# Patient Record
Sex: Male | Born: 1980 | Race: White | Hispanic: No | Marital: Single | Smoking: Current every day smoker
Health system: Southern US, Community
[De-identification: ages and names within clinical notes are randomized; demographics above are authoritative.]

## PROBLEM LIST (undated history)

## (undated) DIAGNOSIS — F1120 Opioid dependence, uncomplicated: Secondary | ICD-10-CM

## (undated) DIAGNOSIS — M5136 Other intervertebral disc degeneration, lumbar region: Secondary | ICD-10-CM

## (undated) DIAGNOSIS — M5126 Other intervertebral disc displacement, lumbar region: Secondary | ICD-10-CM

---

## 2019-08-10 ENCOUNTER — Emergency Department (HOSPITAL_COMMUNITY)
Admission: EM | Admit: 2019-08-10 | Discharge: 2019-08-10 | Discharge status: Against Medical Advice | Payer: Medicaid Other | Attending: Emergency Medicine | Admitting: Emergency Medicine

## 2019-08-10 ENCOUNTER — Other Ambulatory Visit: Payer: Self-pay

## 2019-08-10 ENCOUNTER — Encounter (HOSPITAL_COMMUNITY): Payer: Self-pay | Admitting: Emergency Medicine

## 2019-08-10 DIAGNOSIS — F17210 Nicotine dependence, cigarettes, uncomplicated: Secondary | ICD-10-CM | POA: Insufficient documentation

## 2019-08-10 DIAGNOSIS — Z5321 Procedure and treatment not carried out due to patient leaving prior to being seen by health care provider: Secondary | ICD-10-CM | POA: Diagnosis not present

## 2019-08-10 DIAGNOSIS — F1123 Opioid dependence with withdrawal: Secondary | ICD-10-CM | POA: Diagnosis not present

## 2019-08-10 DIAGNOSIS — F1193 Opioid use, unspecified with withdrawal: Secondary | ICD-10-CM

## 2019-08-10 MED ORDER — CLONIDINE HCL 0.1 MG PO TABS: 0.1000 mg | ORAL_TABLET | Freq: Once | ORAL | Status: DC

## 2019-08-10 MED ORDER — SODIUM CHLORIDE 0.9 % IV BOLUS (SEPSIS): 1000.0000 mL | Freq: Once | INTRAVENOUS | Status: DC

## 2019-08-10 NOTE — ED Provider Notes (Signed)
[facility] EMERGENCY DEPARTMENT Provider Note   CSN: [id number] Arrival date & time: 08/10/19  1001     History   Chief Complaint Chief Complaint  Patient presents with  . Withdrawal    HPI Ethan Hatfield is a 38 y.o. male.     Patient is a 38 year old gentleman brought in by law enforcement for drug withdrawal symptoms.  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  Patient reports that he has not used since sometime last night.  Reports that he is feeling anxious, jittery, sweating, vomiting and yawning.  Patient has been through withdrawal before and this feels the same.  He denies any alcohol or benzodiazepine use.     No past medical history on file.  There are no active problems to display for this patient.         Home Medications    Prior to Admission medications   Not on File    Family History No family history on file.  Social History Social History   Tobacco Use  . Smoking status: Current Every Day Smoker    Packs/day: 1.00  Substance Use Topics  . Alcohol use: Yes    Comment: occ  . Drug use: Yes    Comment: pt reports fentanyl every 3-4 hours, and meth with last use on 9/12     Allergies   Patient has no known allergies.   Review of Systems Review of Systems  Constitutional: Positive for appetite change, diaphoresis and fatigue. Negative for fever.  Respiratory: Negative for shortness of breath.   Cardiovascular: Negative for chest pain.  Gastrointestinal: Positive for nausea and vomiting. Negative for abdominal pain and diarrhea.  Genitourinary: Negative for dysuria.  Skin: Negative for rash and wound.  Neurological: Positive for tremors. Negative for dizziness and seizures.  Psychiatric/Behavioral: Positive for agitation and sleep disturbance. Negative for confusion and suicidal ideas. The patient is nervous/anxious.   All other systems reviewed and are negative.    Physical  Exam Updated Vital Signs BP (!) 146/73 (BP Location: Left Arm)   Pulse (!) 106   Temp 98.4 F (36.9 C) (Oral)   Resp 20   Ht 5\' 9"  (1.753 m)   Wt 83.9 kg   SpO2 93%   BMI 27.32 kg/m   Physical Exam Vitals signs and nursing note reviewed.  Constitutional:      Appearance: Normal appearance.     Comments: Mildly diaphoretic  HENT:     Head: Normocephalic.     Nose: Nose normal.     Mouth/Throat:     Mouth: Mucous membranes are moist.  Eyes:     Conjunctiva/sclera: Conjunctivae normal.  Cardiovascular:     Rate and Rhythm: Normal rate and regular rhythm.  Pulmonary:     Effort: Pulmonary effort is normal.  Abdominal:     General: Abdomen is flat. Bowel sounds are normal. There is no distension.     Tenderness: There is no abdominal tenderness.  Skin:    General: Skin is dry.  Neurological:     Mental Status: He is alert.     Comments: Mild tremor at rest, patient yawning  Psychiatric:        Mood and Affect: Mood normal.      ED Treatments / Results  Labs (all labs ordered are listed, but only abnormal results are displayed) Labs Reviewed - No data to display  EKG EKG Interpretation  Date/Time:  Saturday August 10 2019 10:29:27  EDT Ventricular Rate:  98 PR Interval:    QRS Duration: 112 QT Interval:  373 QTC Calculation: 477 R Axis:   109 Text Interpretation:  Sinus rhythm Consider left ventricular hypertrophy Borderline ST elevation, lateral leads Borderline prolonged QT interval No old tracing to compare Confirmed by Samuel JesterMcManus, Kathleen [phone]([id number]) on 08/10/2019 12:11:50 PM   Radiology No results found.  Procedures Procedures (including critical care time)  Medications Ordered in ED Medications - No data to display   Initial Impression / Assessment and Plan / ED Course  I have reviewed the triage vital signs and the nursing notes.  Pertinent labs & imaging results that were available during my care of the patient were reviewed by me and considered  in my medical decision making (see chart for details).  Clinical Course as of Aug 10 1955  Sat Aug 10, 2019  1301 Upon going to reassess the patient, he was no longer in the room. Per staff, he left AMA.  Patient appeared to be in moderate withdrawal and clonidine and saline bolus was ordered for such, however the patient left prior to this treatment.   [KM]    Clinical Course User Index [KM] Arlyn DunningMcLean, Rikita Grabert A, PA-C         Final Clinical Impressions(s) / ED Diagnoses   Final diagnoses:  None    ED Discharge Orders    None       Jeral PinchMcLean, Detrice Cales A, PA-C 08/10/19 1956    Vanetta MuldersZackowski, Scott, MD 08/24/19 [phone]

## 2019-08-10 NOTE — ED Notes (Signed)
Patient's medication's being pulled from pyxis when patient came out of room and was trying to ambulate in hall. Patient told that he needed to return to his room so that he can be given medication and that he could not walk around in hallway due to Covid. Patient states Then I'm just going to leave then." Patient told that if he wanted to leave he would need to sign out AMA. Patient states "I'm not the one wanting to leave. Ya''ll are making me." Patient told that he was not being told to leave that he just needed to return to room due to Covid. Patient told that he had medication ordered to help with his withdrawals. Patient states "I don't want it. I'm leaving." Patient still has leads on him." Patient walking down hall wrapped in blanket. Patient told leads needed to be returned. Patient ripped leads off and through them in the hall. Patient refusing to sign AMA. Security made aware.

## 2019-08-10 NOTE — ED Notes (Signed)
Pt came out of his room and became belligerent to staff asking to leave. When attempted to stop pt to remove his monitor leads, he ripped them off and threw them in the floor. Pt does not have IV access. Security called and pt left out ED entrance.

## 2019-08-10 NOTE — ED Triage Notes (Signed)
Per EMS, pt was arrested this am and reported chest pain/shortness of breath while in custody. Pt reports typically uses fentanyl every 3-4 hours, last dose "over 12 hours ago." pt reports last use of meth "yesterday." pt alert, diaphoretic. VSS at this time.

## 2019-09-10 ENCOUNTER — Emergency Department (HOSPITAL_COMMUNITY): Payer: Medicaid Other

## 2019-09-10 ENCOUNTER — Encounter (HOSPITAL_COMMUNITY): Payer: Self-pay

## 2019-09-10 ENCOUNTER — Other Ambulatory Visit: Payer: Self-pay

## 2019-09-10 DIAGNOSIS — L02415 Cutaneous abscess of right lower limb: Secondary | ICD-10-CM | POA: Diagnosis not present

## 2019-09-10 DIAGNOSIS — L03115 Cellulitis of right lower limb: Secondary | ICD-10-CM | POA: Diagnosis not present

## 2019-09-10 DIAGNOSIS — M795 Residual foreign body in soft tissue: Secondary | ICD-10-CM | POA: Diagnosis not present

## 2019-09-10 DIAGNOSIS — M79651 Pain in right thigh: Secondary | ICD-10-CM | POA: Diagnosis present

## 2019-09-10 DIAGNOSIS — F17210 Nicotine dependence, cigarettes, uncomplicated: Secondary | ICD-10-CM | POA: Diagnosis not present

## 2019-09-10 NOTE — ED Triage Notes (Addendum)
Pt brought to ED by [facility] for right leg pain x 2 weeks. Pt states he was smoking pot and the bowl broke and glass cut his leg. Pt leg swollen, red with laceration. Pt denies fever. Pt with history of drug use, requesting information for detox programs.

## 2019-09-11 ENCOUNTER — Emergency Department (HOSPITAL_COMMUNITY)
Admission: EM | Admit: 2019-09-11 | Discharge: 2019-09-11 | Disposition: A | Discharge status: Home / Self Care | Payer: Medicaid Other | Attending: Emergency Medicine | Admitting: Emergency Medicine

## 2019-09-11 DIAGNOSIS — Z189 Retained foreign body fragments, unspecified material: Secondary | ICD-10-CM

## 2019-09-11 DIAGNOSIS — L03115 Cellulitis of right lower limb: Secondary | ICD-10-CM

## 2019-09-11 HISTORY — DX: Other intervertebral disc displacement, lumbar region: M51.26

## 2019-09-11 HISTORY — DX: Opioid dependence, uncomplicated: F11.20

## 2019-09-11 HISTORY — DX: Other intervertebral disc degeneration, lumbar region: M51.36

## 2019-09-11 LAB — CBC WITH DIFFERENTIAL/PLATELET
Abs Immature Granulocytes: 0.05 10*3/uL (ref 0.00–0.07)
Basophils Absolute: 0.1 10*3/uL (ref 0.0–0.1)
Basophils Relative: 0 %
Eosinophils Absolute: 0.2 10*3/uL (ref 0.0–0.5)
Eosinophils Relative: 1 %
HCT: 40.7 % (ref 39.0–52.0)
Hemoglobin: 13.4 g/dL (ref 13.0–17.0)
Immature Granulocytes: 0 %
Lymphocytes Relative: 20 %
Lymphs Abs: 3 10*3/uL (ref 0.7–4.0)
MCH: 32.2 pg (ref 26.0–34.0)
MCHC: 32.9 g/dL (ref 30.0–36.0)
MCV: 97.8 fL (ref 80.0–100.0)
Monocytes Absolute: 0.6 10*3/uL (ref 0.1–1.0)
Monocytes Relative: 4 %
Neutro Abs: 10.8 10*3/uL — ABNORMAL HIGH (ref 1.7–7.7)
Neutrophils Relative %: 75 %
Platelets: 244 10*3/uL (ref 150–400)
RBC: 4.16 MIL/uL — ABNORMAL LOW (ref 4.22–5.81)
RDW: 12.2 % (ref 11.5–15.5)
WBC: 14.7 10*3/uL — ABNORMAL HIGH (ref 4.0–10.5)
nRBC: 0 % (ref 0.0–0.2)

## 2019-09-11 LAB — COMPREHENSIVE METABOLIC PANEL
ALT: 18 U/L (ref 0–44)
AST: 16 U/L (ref 15–41)
Albumin: 3.6 g/dL (ref 3.5–5.0)
Alkaline Phosphatase: 50 U/L (ref 38–126)
Anion gap: 6 (ref 5–15)
BUN: 14 mg/dL (ref 6–20)
CO2: 29 mmol/L (ref 22–32)
Calcium: 8.4 mg/dL — ABNORMAL LOW (ref 8.9–10.3)
Chloride: 100 mmol/L (ref 98–111)
Creatinine, Ser: 0.76 mg/dL (ref 0.61–1.24)
GFR calc Af Amer: >60 mL/min (ref >60)
GFR calc non Af Amer: >60 mL/min (ref >60)
Glucose, Bld: 109 mg/dL — ABNORMAL HIGH (ref 70–99)
Potassium: 3.6 mmol/L (ref 3.5–5.1)
Sodium: 135 mmol/L (ref 135–145)
Total Bilirubin: 0.4 mg/dL (ref 0.3–1.2)
Total Protein: 6.7 g/dL (ref 6.5–8.1)

## 2019-09-11 LAB — LACTIC ACID, PLASMA: Lactic Acid, Venous: 0.7 mmol/L (ref 0.5–1.9)

## 2019-09-11 MED ORDER — CLINDAMYCIN HCL 300 MG PO CAPS: 300.0000 mg | ORAL_CAPSULE | Freq: Three times a day (TID) | ORAL | 0 refills | Status: AC

## 2019-09-11 MED ORDER — CLINDAMYCIN PHOSPHATE 600 MG/50ML IV SOLN
600.0000 mg | Freq: Once | INTRAVENOUS | Status: DC
  Filled 2019-09-11: qty 50

## 2019-09-11 MED ORDER — LIDOCAINE-EPINEPHRINE (PF) 1 %-1:200000 IJ SOLN
20.0000 mL | Freq: Once | INTRAMUSCULAR | Status: AC
  Administered 2019-09-11: 20 mL

## 2019-09-11 MED ORDER — OXYCODONE-ACETAMINOPHEN 5-325 MG PO TABS
2.0000 | ORAL_TABLET | Freq: Once | ORAL | Status: AC
  Administered 2019-09-11: 02:00:00 2 via ORAL
  Filled 2019-09-11: qty 2

## 2019-09-11 NOTE — ED Provider Notes (Signed)
[facility] EMERGENCY DEPARTMENT Provider Note   CSN: [id number] Arrival date & time: 09/10/19  2224     History   Chief Complaint Chief Complaint  Patient presents with  . Leg Pain    HPI Ethan Hatfield is a 38 y.o. male.     Patient with previous history of substance abuse presenting with right leg pain and wound.  States he was smoking marijuana approximately 10 days ago and he put the glass bowl in his sock to hide it.  A door then hit his leg and ruptured the glass bowl causing it to break and cut his leg.  he did not see anyone about this initially.  He states her multiple glass fragments that he removed on his own but is not sure that he got them all. He comes in today with worsening pain, swelling, redness and drainage.  He has had increased drainage from the wound surrounding redness extending up his leg and swelling of his leg. Denies fevers, chills, nausea, vomiting. States last IV drug use was 30 days ago. States he has used IV drugs in the past but has not for about the past 1 month.  Denies any history of diabetes. Tetanus is up-to-date.  The history is provided by the patient and the EMS personnel.  Leg Pain Associated symptoms: no fatigue and no fever     Past Medical History:  Diagnosis Date  . Bulging lumbar disc   . Opiate dependence (HCC)     There are no active problems to display for this patient.   History reviewed. No pertinent surgical history.      Home Medications    Prior to Admission medications   Not on File    Family History No family history on file.  Social History Social History   Tobacco Use  . Smoking status: Current Every Day Smoker    Packs/day: 1.00  . Smokeless tobacco: Never Used  Substance Use Topics  . Alcohol use: Yes    Comment: occ  . Drug use: Yes    Types: Marijuana    Comment: pt reports fentanyl every 3-4 hours, and meth with last use on 10/12,     Allergies   Patient has no known allergies.    Review of Systems Review of Systems  Constitutional: Negative for activity change, appetite change, fatigue and fever.  HENT: Negative for congestion and rhinorrhea.   Eyes: Negative for visual disturbance.  Respiratory: Negative for cough, choking and shortness of breath.   Cardiovascular: Negative for chest pain.  Gastrointestinal: Negative for abdominal pain, nausea and vomiting.  Genitourinary: Negative for dysuria and hematuria.  Musculoskeletal: Negative for arthralgias and myalgias.  Skin: Positive for rash and wound.  Neurological: Negative for dizziness, weakness and headaches.   all other systems are negative except as noted in the HPI and PMH.     Physical Exam Updated Vital Signs BP (!) 152/83 (BP Location: Right Arm)   Pulse (!) 108   Temp 98.5 F (36.9 C) (Oral)   Resp 20   Ht  (1.753 m)   Wt 79.4 kg   SpO2 100%   BMI 25.84 kg/m   Physical Exam Vitals signs and nursing note reviewed.  Constitutional:      General: He is not in acute distress.    Appearance: He is well-developed.  HENT:     Head: Normocephalic and atraumatic.     Mouth/Throat:     Pharynx: No oropharyngeal exudate.  Eyes:  Conjunctiva/sclera: Conjunctivae normal.     Pupils: Pupils are equal, round, and reactive to light.  Neck:     Musculoskeletal: Normal range of motion and neck supple.     Comments: No meningismus. Cardiovascular:     Rate and Rhythm: Normal rate and regular rhythm.     Heart sounds: Normal heart sounds. No murmur.  Pulmonary:     Effort: Pulmonary effort is normal. No respiratory distress.     Breath sounds: Normal breath sounds.  Chest:     Chest wall: No tenderness.  Abdominal:     Palpations: Abdomen is soft.     Tenderness: There is no abdominal tenderness. There is no guarding or rebound.  Musculoskeletal: Normal range of motion.        General: Swelling, tenderness and signs of injury present.     Right lower leg: Edema present.     Comments:  Right lower leg has an open wound with some clear purulent drainage and surrounding erythema.  Erythema extends across right lower leg with tenderness.  Compartments are soft. FROM hip and knee.  Intact DP and PT pulses.  Skin:    General: Skin is warm.     Capillary Refill: Capillary refill takes less than 2 seconds.     Findings: Erythema, lesion and rash present.  Neurological:     Mental Status: He is alert and oriented to person, place, and time.     Cranial Nerves: No cranial nerve deficit.     Motor: No abnormal muscle tone.     Coordination: Coordination normal.     Comments: No ataxia on finger to nose bilaterally. No pronator drift. 5/5 strength throughout. CN 2-12 intact.Equal grip strength. Sensation intact.   Psychiatric:        Behavior: Behavior normal.          ED Treatments / Results  Labs (all labs ordered are listed, but only abnormal results are displayed) Labs Reviewed  CBC WITH DIFFERENTIAL/PLATELET - Abnormal; Notable for the following components:      Result Value   WBC 14.7 (*)    RBC 4.16 (*)    Neutro Abs 10.8 (*)    All other components within normal limits  COMPREHENSIVE METABOLIC PANEL - Abnormal; Notable for the following components:   Glucose, Bld 109 (*)    Calcium 8.4 (*)    All other components within normal limits  CULTURE, BLOOD (ROUTINE X 2)  LACTIC ACID, PLASMA    EKG None  Radiology Dg Tibia/fibula Right  Result Date: 09/10/2019 CLINICAL DATA:  38 year old male with 2 weeks of right leg pain after laceration from broken glass. Anterior tib fib area of injury. EXAM: RIGHT TIBIA AND FIBULA - 2 VIEW COMPARISON:  None. FINDINGS: There are several faint linear retained foreign body suspected along the medial distal tibia soft tissues in an area of focal soft tissue swelling and stranding (arrows). No superimposed soft tissue gas. No osseous abnormality identified aside from some degenerative calcaneus spurring. IMPRESSION: 1.  Multiple small linear retained foreign bodies in the area of soft tissue swelling along the medial distal tibia. 2. No acute osseous abnormality. Electronically Signed   By: Genevie Ann M.D.   On: 09/10/2019 23:04    Procedures Ultrasound ED Soft Tissue  Date/Time: 09/11/2019 1:43 AM Performed by: Ezequiel Essex, MD Authorized by: Ezequiel Essex, MD   Procedure details:    Indications: limb pain, localization of abscess, evaluate for cellulitis and evaluate for foreign body  Transverse view:  Visualized   Longitudinal view:  Visualized   Images: archived     Limitations:  Body habitus Location:    Location: lower extremity     Side:  Right Findings:     abscess present    cellulitis present    no foreign body present .Marland KitchenIncision and Drainage  Date/Time: 09/11/2019 2:33 AM Performed by: Glynn Octave, MD Authorized by: Glynn Octave, MD   Consent:    Consent obtained:  Verbal   Consent given by:  Patient   Risks discussed:  Damage to other organs, infection, incomplete drainage, pain and bleeding   Alternatives discussed:  No treatment Location:    Type:  Abscess   Size:  2   Location:  Lower extremity   Lower extremity location:  Leg   Leg location:  R lower leg Pre-procedure details:    Skin preparation:  Betadine Anesthesia (see MAR for exact dosages):    Anesthesia method:  Local infiltration   Local anesthetic:  Lidocaine 1% w/o epi Procedure type:    Complexity:  Simple Procedure details:    Needle aspiration: no     Incision types:  Stab incision   Incision depth:  Subcutaneous   Scalpel blade:  11   Wound management:  Probed and deloculated and irrigated with saline   Drainage:  Purulent   Drainage amount:  Moderate   Wound treatment:  Wound left open   Packing materials:  None Post-procedure details:    Patient tolerance of procedure:  Tolerated well, no immediate complications .Foreign Body Removal  Date/Time: 09/11/2019 5:27 AM Performed  by: Glynn Octave, MD Authorized by: Glynn Octave, MD  Consent: Verbal consent obtained. Risks and benefits: risks, benefits and alternatives were discussed Consent given by: patient Site marked: the operative site was marked Imaging studies: imaging studies available Patient identity confirmed: verbally with patient and provided demographic data Time out: Immediately prior to procedure a "time out" was called to verify the correct patient, procedure, equipment, support staff and site/side marked as required. Body area: skin General location: lower extremity Location details: right lower leg Anesthesia: local infiltration  Anesthesia: Local Anesthetic: lidocaine 1% without epinephrine  Sedation: Patient sedated: no  Patient restrained: no Patient cooperative: yes Localization method: probed and visualized Removal mechanism: forceps Tendon involvement: superficial Complexity: simple 2 objects recovered. Objects recovered: glass Post-procedure assessment: residual foreign bodies remain Patient tolerance: patient tolerated the procedure well with no immediate complications   (including critical care time)  Medications Ordered in ED Medications  clindamycin (CLEOCIN) IVPB 600 mg (has no administration in time range)     Initial Impression / Assessment and Plan / ED Course  I have reviewed the triage vital signs and the nursing notes.  Pertinent labs & imaging results that were available during my care of the patient were reviewed by me and considered in my medical decision making (see chart for details).        Wound to right leg from broken glass 10 days ago.  No fever.  Tetanus is up-to-date.  X-ray shows possible small retained glass fragments.  No fracture. Discussed with patient that the small foreign bodies did not need to be removed emergently and will likely work themselves out. He is agreeable to wound exploration.  Bedside ultrasound shows possible  fluid collection.  Incision and drainage was performed with expression of some purulence and a few small glass fragments.  Wound was irrigated.  Patient given dose of IV clindamycin.  Blood  cultures obtained.  Patient does not want to be admitted.  He is agreeable to wound check in 2 days with Dr. Romeo AppleHarrison or in the ED.  He is aware that the small glass fragments could remain in his wound but they have been there for 10 days and do not need to be emergently removed today.  They will likely work themselves out of the wound.  He does not appear to be toxic or septic.  He is agreeable to recheck in 2 days with Dr. Romeo AppleHarrison in the ED.  Return to the ED sooner with worsening pain, spreading redness, fever, vomiting, any other concerns. Final Clinical Impressions(s) / ED Diagnoses   Final diagnoses:  Cellulitis of leg, right  Retained foreign body    ED Discharge Orders         Ordered    clindamycin (CLEOCIN) 300 MG capsule  3 times daily     09/11/19 0455           Glynn Octaveancour, Severina Sykora, MD 09/11/19 [phone]

## 2019-09-11 NOTE — Discharge Instructions (Signed)
Take the antibiotics as prescribed.  Perform warm soaks in the tub twice daily.  There may be some tiny glass fragments that remain. keep the wound open.  Follow-up for wound check in 2 days with Dr. Aline Brochure or in the ED if you are not able to see him.  Return to the ED sooner if you develop worsening pain, spreading redness, fever, vomiting, any concerns.

## 2019-09-16 LAB — CULTURE, BLOOD (ROUTINE X 2): Culture: NO GROWTH

## 2020-01-21 ENCOUNTER — Other Ambulatory Visit: Payer: Self-pay

## 2020-01-21 ENCOUNTER — Encounter (HOSPITAL_COMMUNITY): Payer: Self-pay | Admitting: Emergency Medicine

## 2020-01-21 ENCOUNTER — Emergency Department (HOSPITAL_COMMUNITY): Payer: Medicaid Other

## 2020-01-21 ENCOUNTER — Emergency Department (HOSPITAL_COMMUNITY)
Admission: EM | Admit: 2020-01-21 | Discharge: 2020-01-21 | Disposition: A | Discharge status: Home / Self Care | Payer: Medicaid Other | Attending: Emergency Medicine | Admitting: Emergency Medicine

## 2020-01-21 DIAGNOSIS — Z20822 Contact with and (suspected) exposure to covid-19: Secondary | ICD-10-CM | POA: Diagnosis not present

## 2020-01-21 DIAGNOSIS — Z79899 Other long term (current) drug therapy: Secondary | ICD-10-CM | POA: Diagnosis not present

## 2020-01-21 DIAGNOSIS — R569 Unspecified convulsions: Secondary | ICD-10-CM | POA: Diagnosis not present

## 2020-01-21 DIAGNOSIS — F17200 Nicotine dependence, unspecified, uncomplicated: Secondary | ICD-10-CM | POA: Insufficient documentation

## 2020-01-21 LAB — CBC WITH DIFFERENTIAL/PLATELET
Abs Immature Granulocytes: 0.03 10*3/uL (ref 0.00–0.07)
Basophils Absolute: 0 10*3/uL (ref 0.0–0.1)
Basophils Relative: 0 %
Eosinophils Absolute: 0 10*3/uL (ref 0.0–0.5)
Eosinophils Relative: 0 %
HCT: 49.3 % (ref 39.0–52.0)
Hemoglobin: 16.7 g/dL (ref 13.0–17.0)
Immature Granulocytes: 0 %
Lymphocytes Relative: 24 %
Lymphs Abs: 2.3 10*3/uL (ref 0.7–4.0)
MCH: 31.4 pg (ref 26.0–34.0)
MCHC: 33.9 g/dL (ref 30.0–36.0)
MCV: 92.7 fL (ref 80.0–100.0)
Monocytes Absolute: 0.4 10*3/uL (ref 0.1–1.0)
Monocytes Relative: 4 %
Neutro Abs: 7 10*3/uL (ref 1.7–7.7)
Neutrophils Relative %: 72 %
Platelets: 377 10*3/uL (ref 150–400)
RBC: 5.32 MIL/uL (ref 4.22–5.81)
RDW: 12.3 % (ref 11.5–15.5)
WBC: 9.8 10*3/uL (ref 4.0–10.5)
nRBC: 0 % (ref 0.0–0.2)

## 2020-01-21 LAB — COMPREHENSIVE METABOLIC PANEL WITH GFR
ALT: 41 U/L (ref 0–44)
AST: 29 U/L (ref 15–41)
Albumin: 4.1 g/dL (ref 3.5–5.0)
Alkaline Phosphatase: 57 U/L (ref 38–126)
Anion gap: 12 (ref 5–15)
BUN: 16 mg/dL (ref 6–20)
CO2: 26 mmol/L (ref 22–32)
Calcium: 9.4 mg/dL (ref 8.9–10.3)
Chloride: 100 mmol/L (ref 98–111)
Creatinine, Ser: 0.88 mg/dL (ref 0.61–1.24)
GFR calc Af Amer: >60 mL/min
GFR calc non Af Amer: >60 mL/min
Glucose, Bld: 98 mg/dL (ref 70–99)
Potassium: 2.9 mmol/L — ABNORMAL LOW (ref 3.5–5.1)
Sodium: 138 mmol/L (ref 135–145)
Total Bilirubin: 0.9 mg/dL (ref 0.3–1.2)
Total Protein: 8.2 g/dL — ABNORMAL HIGH (ref 6.5–8.1)

## 2020-01-21 LAB — URINALYSIS, ROUTINE W REFLEX MICROSCOPIC
Bilirubin Urine: NEGATIVE
Glucose, UA: NEGATIVE mg/dL
Hgb urine dipstick: NEGATIVE
Ketones, ur: 5 mg/dL — AB
Leukocytes,Ua: NEGATIVE
Nitrite: NEGATIVE
Protein, ur: NEGATIVE mg/dL
Specific Gravity, Urine: 1.02 (ref 1.005–1.030)
pH: 6 (ref 5.0–8.0)

## 2020-01-21 LAB — CBG MONITORING, ED: Glucose-Capillary: 97 mg/dL (ref 70–99)

## 2020-01-21 LAB — RAPID URINE DRUG SCREEN, HOSP PERFORMED
Amphetamines: POSITIVE — AB
Barbiturates: NOT DETECTED
Benzodiazepines: NOT DETECTED
Cocaine: NOT DETECTED
Opiates: NOT DETECTED
Tetrahydrocannabinol: POSITIVE — AB

## 2020-01-21 LAB — MAGNESIUM: Magnesium: 1.8 mg/dL (ref 1.7–2.4)

## 2020-01-21 LAB — PHOSPHORUS: Phosphorus: 3.2 mg/dL (ref 2.5–4.6)

## 2020-01-21 LAB — ETHANOL: Alcohol, Ethyl (B): <10 mg/dL (ref <10)

## 2020-01-21 LAB — POC SARS CORONAVIRUS 2 AG -  ED: SARS Coronavirus 2 Ag: NEGATIVE

## 2020-01-21 MED ORDER — LORAZEPAM 2 MG/ML IJ SOLN
1.0000 mg | Freq: Once | INTRAMUSCULAR | Status: AC | PRN
  Administered 2020-01-21: 1 mg via INTRAVENOUS
  Filled 2020-01-21: qty 1

## 2020-01-21 MED ORDER — ONDANSETRON 8 MG PO TBDP: 8.0000 mg | ORAL_TABLET | Freq: Three times a day (TID) | ORAL | 0 refills | Status: AC | PRN

## 2020-01-21 MED ORDER — PREGABALIN 50 MG PO CAPS
100.0000 mg | ORAL_CAPSULE | Freq: Three times a day (TID) | ORAL | Status: DC
  Administered 2020-01-21: 100 mg via ORAL
  Filled 2020-01-21: qty 2

## 2020-01-21 MED ORDER — POTASSIUM CHLORIDE CRYS ER 20 MEQ PO TBCR
40.0000 meq | EXTENDED_RELEASE_TABLET | Freq: Once | ORAL | Status: AC
  Administered 2020-01-21: 40 meq via ORAL
  Filled 2020-01-21: qty 2

## 2020-01-21 MED ORDER — PREGABALIN 150 MG PO CAPS: 150.0000 mg | ORAL_CAPSULE | Freq: Two times a day (BID) | ORAL | 0 refills | Status: AC

## 2020-01-21 MED ORDER — SODIUM CHLORIDE 0.9 % IV BOLUS
1000.0000 mL | Freq: Once | INTRAVENOUS | Status: AC
  Administered 2020-01-21: 1000 mL via INTRAVENOUS

## 2020-01-21 MED ORDER — ONDANSETRON HCL 4 MG/2ML IJ SOLN
4.0000 mg | Freq: Once | INTRAMUSCULAR | Status: AC
  Administered 2020-01-21: 4 mg via INTRAVENOUS
  Filled 2020-01-21: qty 2

## 2020-01-21 MED ORDER — SODIUM CHLORIDE 0.9 % IV SOLN: INTRAVENOUS | Status: DC

## 2020-01-21 MED ORDER — SODIUM CHLORIDE 0.9 % IV BOLUS
1000.0000 mL | Freq: Once | INTRAVENOUS | Status: AC
  Administered 2020-01-21: 16:00:00 1000 mL via INTRAVENOUS

## 2020-01-21 NOTE — ED Triage Notes (Signed)
Patient brought In by EMS from [facility] jail d/t unwitnessed seizure. Police accompanied w/ patient. Patient reports he fell when seizure began, hitting right cheek. No postictal state.  BP 124/80 100% RA P 78 CBG 94

## 2020-01-21 NOTE — ED Notes (Signed)
Patient began seizing at approx 1509. Provider at bedside.

## 2020-01-21 NOTE — ED Provider Notes (Signed)
[facility]-EMERGENCY DEPT Provider Note   CSN: [id number] Arrival date & time: 01/21/20  1453     History Chief Complaint  Patient presents with  . Seizures    Ethan Hatfield is a 39 y.o. male.  HPI   Patient presents to the ED for evaluation of a seizure.  Patient has a history of seizure disorder.  Patient states he has been prescribed Lyrica.  [facility] [place] [facility] so he has not been taking his medication.  EMS was called as the patient had an unwitnessed seizure.  Patient himself states he fell when the seizure began.  He states he hit his check.  He was confused after the event.  Patient states he has been having some trouble with cough and chills.  He has had some nausea vomiting and diarrhea.  Past Medical History:  Diagnosis Date  . Bulging lumbar disc   . Opiate dependence (HCC)     There are no problems to display for this patient.   History reviewed. No pertinent surgical history.     History reviewed. No pertinent family history.  Social History   Tobacco Use  . Smoking status: Current Every Day Smoker    Packs/day: 1.00  . Smokeless tobacco: Never Used  Substance Use Topics  . Alcohol use: Yes    Comment: occ  . Drug use: Yes    Types: Marijuana    Comment: pt reports fentanyl every 3-4 hours, and meth with last use on 10/12,    Home Medications Prior to Admission medications   Medication Sig Start Date End Date Taking? Authorizing Provider  buprenorphine-naloxone (SUBOXONE) 8-2 mg SUBL SL tablet Place 1 tablet under the tongue daily.   Yes [provider]  pregabalin (LYRICA) 300 MG capsule Take 300 mg by mouth 2 (two) times daily.   Yes [provider]  clindamycin (CLEOCIN) 300 MG capsule Take 1 capsule (300 mg total) by mouth 3 (three) times daily. Patient not taking: Reported on 01/21/2020 09/11/19   Glynn Octave, MD  ondansetron (ZOFRAN ODT) 8 MG disintegrating tablet Take 1  tablet (8 mg total) by mouth every 8 (eight) hours as needed for nausea or vomiting. 01/21/20   Linwood Dibbles, MD  pregabalin (LYRICA) 150 MG capsule Take 1 capsule (150 mg total) by mouth 2 (two) times daily. 01/21/20   Linwood Dibbles, MD    Allergies    Patient has no known allergies.  Review of Systems   Review of Systems  All other systems reviewed and are negative.   Physical Exam Updated Vital Signs BP 135/84   Pulse 90   Temp 98.2 F (36.8 C) (Oral)   Resp 18   SpO2 99%   Physical Exam Vitals and nursing note reviewed.  Constitutional:      General: He is not in acute distress.    Appearance: He is well-developed.  HENT:     Head: Normocephalic and atraumatic.     Right Ear: External ear normal.     Left Ear: External ear normal.  Eyes:     General: No scleral icterus.       Right eye: No discharge.        Left eye: No discharge.     Conjunctiva/sclera: Conjunctivae normal.  Neck:     Trachea: No tracheal deviation.  Cardiovascular:     Rate and Rhythm: Normal rate and regular rhythm.  Pulmonary:     Effort: Pulmonary effort is normal.  No respiratory distress.     Breath sounds: Normal breath sounds. No stridor. No wheezing or rales.  Abdominal:     General: Bowel sounds are normal. There is no distension.     Palpations: Abdomen is soft.     Tenderness: There is no abdominal tenderness. There is no guarding or rebound.  Musculoskeletal:        General: No tenderness.     Cervical back: Neck supple.  Skin:    General: Skin is warm and dry.     Findings: No rash.  Neurological:     Mental Status: He is alert.     Cranial Nerves: No cranial nerve deficit (no facial droop, extraocular movements intact, no slurred speech).     Sensory: No sensory deficit.     Motor: No abnormal muscle tone or seizure activity.     Coordination: Coordination normal.     ED Results / Procedures / Treatments   Labs (all labs ordered are listed, but only abnormal results are  displayed) Labs Reviewed  COMPREHENSIVE METABOLIC PANEL - Abnormal; Notable for the following components:      Result Value   Potassium 2.9 (*)    Total Protein 8.2 (*)    All other components within normal limits  RAPID URINE DRUG SCREEN, HOSP PERFORMED - Abnormal; Notable for the following components:   Amphetamines POSITIVE (*)    Tetrahydrocannabinol POSITIVE (*)    All other components within normal limits  URINALYSIS, ROUTINE W REFLEX MICROSCOPIC - Abnormal; Notable for the following components:   Ketones, ur 5 (*)    All other components within normal limits  CBC WITH DIFFERENTIAL/PLATELET  MAGNESIUM  PHOSPHORUS  ETHANOL  CBG MONITORING, ED  CBG MONITORING, ED  POC SARS CORONAVIRUS 2 AG -  ED    EKG EKG Interpretation  Date/Time:  Tuesday January 21 2020 15:06:07 EST Ventricular Rate:  86 PR Interval:    QRS Duration: 104 QT Interval:  377 QTC Calculation: 451 R Axis:   102 Text Interpretation: Sinus rhythm Right axis deviation Consider left ventricular hypertrophy No significant change since last tracing Confirmed by Dorie Rank [phone]) on 01/21/2020 3:18:08 PM   Radiology DG Chest Portable 1 View  Result Date: 01/21/2020 CLINICAL DATA:  Cough, seizure EXAM: PORTABLE CHEST 1 VIEW COMPARISON:  None. FINDINGS: The heart size and mediastinal contours are within normal limits. No focal airspace consolidation, pleural effusion, or pneumothorax. The visualized skeletal structures are unremarkable. IMPRESSION: No active disease. Electronically Signed   By: Davina Poke D.O.   On: 01/21/2020 17:13    Procedures Procedures (including critical care time)  Medications Ordered in ED Medications  sodium chloride 0.9 % bolus 1,000 mL (0 mLs Intravenous Stopped 01/21/20 1729)    And  sodium chloride 0.9 % bolus 1,000 mL (0 mLs Intravenous Stopped 01/21/20 1854)    And  0.9 %  sodium chloride infusion ( Intravenous New Bag/Given 01/21/20 1727)  pregabalin (LYRICA) capsule  100 mg (100 mg Oral Given 01/21/20 1641)  ondansetron (ZOFRAN) injection 4 mg (4 mg Intravenous Given 01/21/20 1611)  LORazepam (ATIVAN) injection 1 mg (1 mg Intravenous Given 01/21/20 1610)  potassium chloride SA (KLOR-CON) CR tablet 40 mEq (40 mEq Oral Given 01/21/20 1857)    ED Course  I have reviewed the triage vital signs and the nursing notes.  Pertinent labs & imaging results that were available during my care of the patient were reviewed by me and considered in my medical decision making (see  chart for details).  Clinical Course as of Jan 20 1858  Tue Jan 21, 2020  1846 Labs reviewed.  No significant abnormalities other than positive amphetamines and THC.  Potassium decreased.  I will give him an oral dose of potassium.  No further seizure activity.  Appears stable for discharge.   [JK]  1857 Notified of questionable seizure activity in the ED.  I walked into the room a minute or two after. Pt is alert and awake. No postictal state.  Doubt true seizure.  Appears stable for dc.  Will rx his meds   [JK]    Clinical Course User Index [JK] Linwood Dibbles, MD   MDM Rules/Calculators/A&P                      Patient's drug screen is positive for amphetamines and marijuana.  Labs notable for hypokalemia.  Patient was monitored in the ED.  Questionable seizure-like activity however I did not witness any of these episodes and there is no postictal state.  Patient states he takes Lyrica.  I will give him a prescription for that medication.  He appears stable for discharge back to jail Final Clinical Impression(s) / ED Diagnoses Final diagnoses:  Seizure-like activity (HCC)    Rx / DC Orders ED Discharge Orders         Ordered    pregabalin (LYRICA) 150 MG capsule  2 times daily     01/21/20 1859    ondansetron (ZOFRAN ODT) 8 MG disintegrating tablet  Every 8 hours PRN     01/21/20 1859           Linwood Dibbles, MD 01/21/20 1900

## 2020-01-21 NOTE — Discharge Instructions (Signed)
Take the medications as prescribed.  Follow up with a primary care doctor or neurologist when able

## 2020-01-21 NOTE — ED Notes (Signed)
Patient was verbalized discharge instructions. Pt had no further questions at this time. NAD. 

## 2020-08-18 IMAGING — DX DG CHEST 1V PORT
1 series · 1 of 1 positions shown · non-contrast
Comparison: None.

CLINICAL DATA: Cough, seizure

EXAM:
PORTABLE CHEST 1 VIEW

[chest ap]
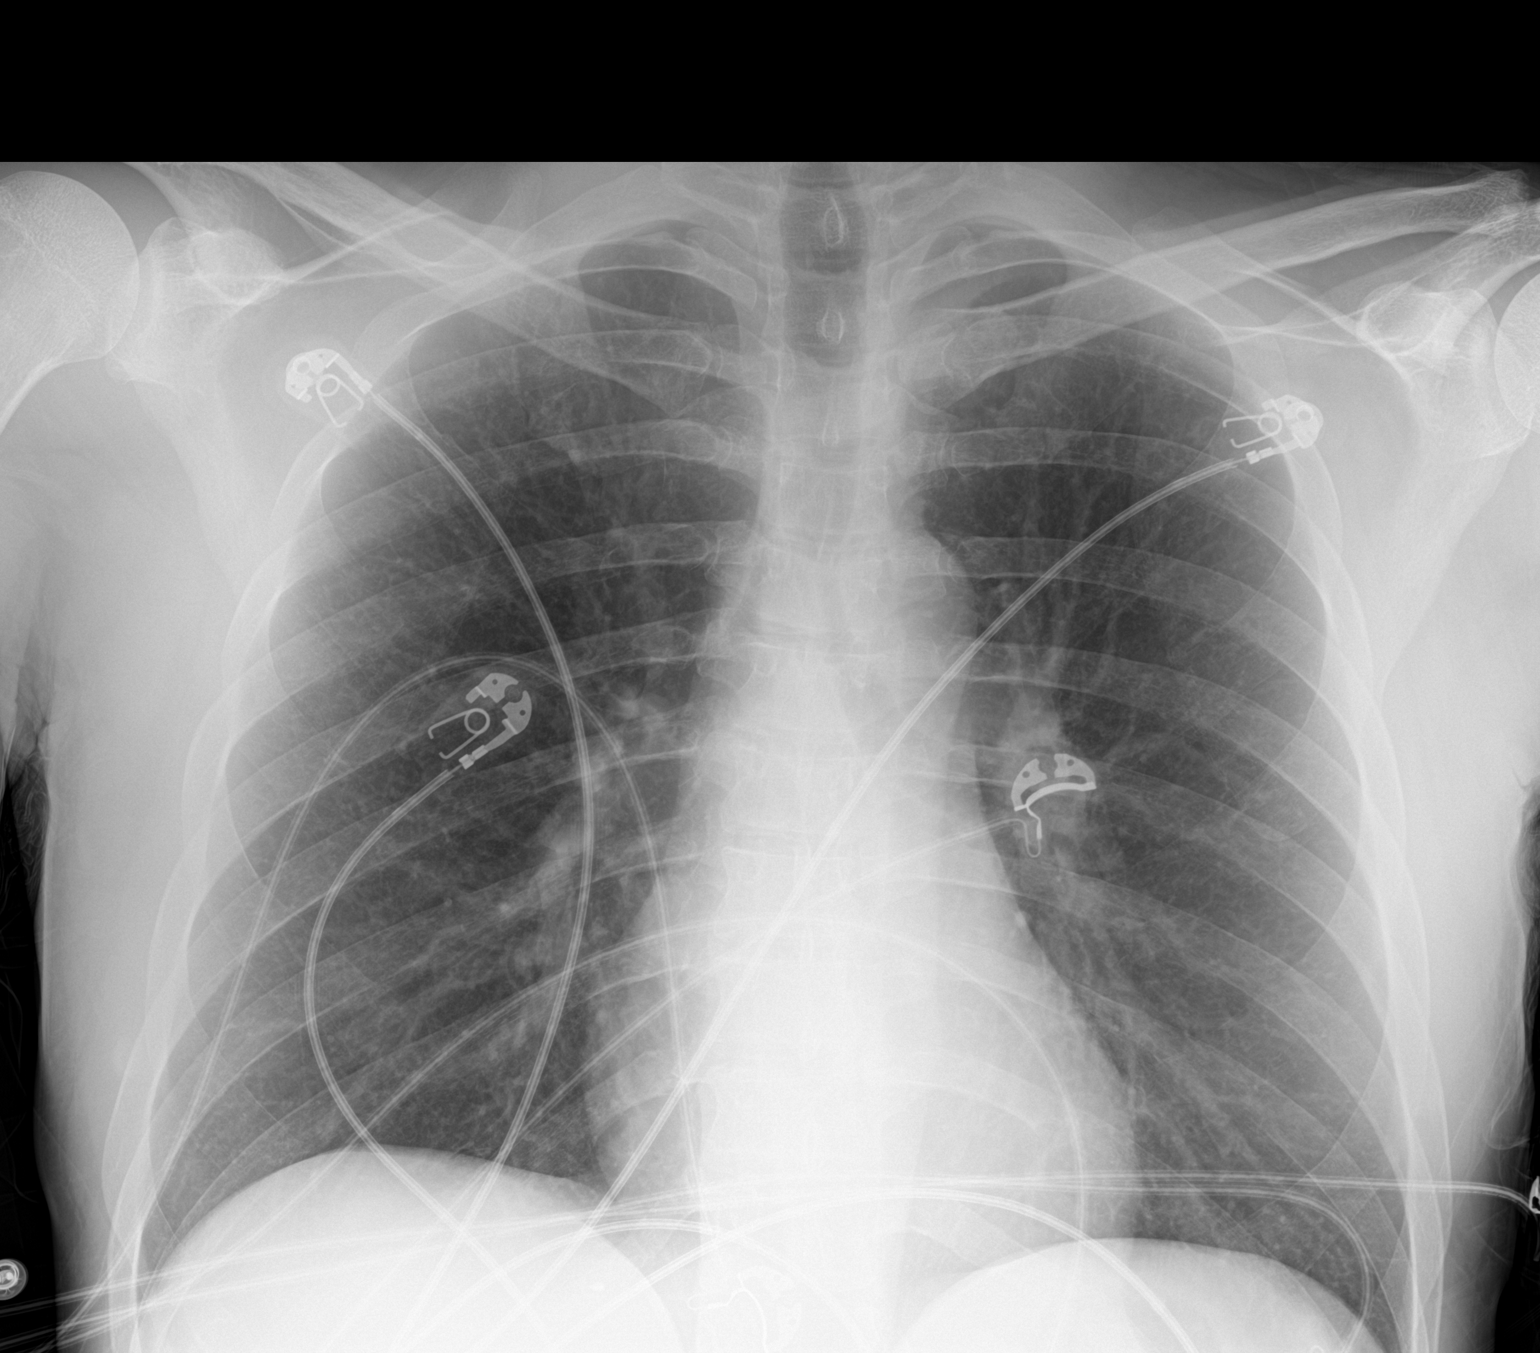

[1 of 1 positions shown; findings below may reference images not displayed]

FINDINGS: The heart size and mediastinal contours are within normal limits. No
focal airspace consolidation, pleural effusion, or pneumothorax. The
visualized skeletal structures are unremarkable.
IMPRESSION: No active disease.
